# Patient Record
Sex: Male | Born: 2000 | Race: White | Hispanic: No | Marital: Single | Smoking: Never smoker
Health system: Southern US, Community
[De-identification: ages and names within clinical notes are randomized; demographics above are authoritative.]

## PROBLEM LIST (undated history)

## (undated) DIAGNOSIS — J309 Allergic rhinitis, unspecified: Secondary | ICD-10-CM

## (undated) HISTORY — PX: TYMPANOSTOMY TUBE PLACEMENT: SHX32

## (undated) HISTORY — DX: Allergic rhinitis, unspecified: J30.9

---
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
## 2019-10-28 ENCOUNTER — Ambulatory Visit: Payer: BC Managed Care – PPO | Attending: Internal Medicine

## 2019-10-28 DIAGNOSIS — Z20822 Contact with and (suspected) exposure to covid-19: Secondary | ICD-10-CM

## 2019-10-29 LAB — NOVEL CORONAVIRUS, NAA: SARS-CoV-2, NAA: NOT DETECTED

## 2020-11-07 ENCOUNTER — Other Ambulatory Visit: Payer: Self-pay

## 2020-11-07 ENCOUNTER — Emergency Department (HOSPITAL_COMMUNITY)
Admission: EM | Admit: 2020-11-07 | Discharge: 2020-11-07 | Disposition: A | Discharge status: Home / Self Care | Payer: BC Managed Care – PPO | Attending: Emergency Medicine | Admitting: Emergency Medicine

## 2020-11-07 ENCOUNTER — Emergency Department (HOSPITAL_COMMUNITY): Payer: BC Managed Care – PPO

## 2020-11-07 ENCOUNTER — Encounter (HOSPITAL_COMMUNITY): Payer: Self-pay | Admitting: Emergency Medicine

## 2020-11-07 DIAGNOSIS — R197 Diarrhea, unspecified: Secondary | ICD-10-CM

## 2020-11-07 DIAGNOSIS — R112 Nausea with vomiting, unspecified: Secondary | ICD-10-CM

## 2020-11-07 DIAGNOSIS — B349 Viral infection, unspecified: Secondary | ICD-10-CM | POA: Diagnosis not present

## 2020-11-07 DIAGNOSIS — Z20822 Contact with and (suspected) exposure to covid-19: Secondary | ICD-10-CM | POA: Diagnosis not present

## 2020-11-07 LAB — COMPREHENSIVE METABOLIC PANEL
ALT: 64 U/L — ABNORMAL HIGH (ref 0–44)
AST: 81 U/L — ABNORMAL HIGH (ref 15–41)
Albumin: 5.4 g/dL — ABNORMAL HIGH (ref 3.5–5.0)
Alkaline Phosphatase: 78 U/L (ref 38–126)
Anion gap: 14 (ref 5–15)
BUN: 18 mg/dL (ref 6–20)
CO2: 21 mmol/L — ABNORMAL LOW (ref 22–32)
Calcium: 9.7 mg/dL (ref 8.9–10.3)
Chloride: 103 mmol/L (ref 98–111)
Creatinine, Ser: 0.96 mg/dL (ref 0.61–1.24)
GFR, Estimated: >60 mL/min (ref >60)
Glucose, Bld: 104 mg/dL — ABNORMAL HIGH (ref 70–99)
Potassium: 4.3 mmol/L (ref 3.5–5.1)
Sodium: 138 mmol/L (ref 135–145)
Total Bilirubin: 2.9 mg/dL — ABNORMAL HIGH (ref 0.3–1.2)
Total Protein: 8 g/dL (ref 6.5–8.1)

## 2020-11-07 LAB — CBC
HCT: 47.9 % (ref 39.0–52.0)
Hemoglobin: 16.6 g/dL (ref 13.0–17.0)
MCH: 29.7 pg (ref 26.0–34.0)
MCHC: 34.7 g/dL (ref 30.0–36.0)
MCV: 85.8 fL (ref 80.0–100.0)
Platelets: 188 10*3/uL (ref 150–400)
RBC: 5.58 MIL/uL (ref 4.22–5.81)
RDW: 12.4 % (ref 11.5–15.5)
WBC: 16.6 10*3/uL — ABNORMAL HIGH (ref 4.0–10.5)
nRBC: 0 % (ref 0.0–0.2)

## 2020-11-07 LAB — URINALYSIS, ROUTINE W REFLEX MICROSCOPIC
Bilirubin Urine: NEGATIVE
Glucose, UA: NEGATIVE mg/dL
Hgb urine dipstick: NEGATIVE
Ketones, ur: 80 mg/dL — AB
Leukocytes,Ua: NEGATIVE
Nitrite: NEGATIVE
Protein, ur: NEGATIVE mg/dL
Specific Gravity, Urine: 1.029 (ref 1.005–1.030)
pH: 5 (ref 5.0–8.0)

## 2020-11-07 LAB — RESP PANEL BY RT-PCR (FLU A&B, COVID) ARPGX2
Influenza A by PCR: NEGATIVE
Influenza B by PCR: NEGATIVE
SARS Coronavirus 2 by RT PCR: NEGATIVE

## 2020-11-07 LAB — LIPASE, BLOOD: Lipase: 32 U/L (ref 11–51)

## 2020-11-07 MED ORDER — ONDANSETRON HCL 4 MG/2ML IJ SOLN
4.0000 mg | Freq: Once | INTRAMUSCULAR | Status: AC
  Administered 2020-11-07: 4 mg via INTRAVENOUS
  Filled 2020-11-07: qty 2

## 2020-11-07 MED ORDER — SODIUM CHLORIDE 0.9 % IV BOLUS
1000.0000 mL | Freq: Once | INTRAVENOUS | Status: AC
  Administered 2020-11-07: 1000 mL via INTRAVENOUS

## 2020-11-07 MED ORDER — ONDANSETRON HCL 8 MG PO TABS: 8.0000 mg | ORAL_TABLET | Freq: Three times a day (TID) | ORAL | 0 refills | Status: AC | PRN

## 2020-11-07 MED ORDER — IOHEXOL 300 MG/ML  SOLN
100.0000 mL | Freq: Once | INTRAMUSCULAR | Status: AC | PRN
  Administered 2020-11-07: 100 mL via INTRAVENOUS

## 2020-11-07 MED ORDER — LORAZEPAM 2 MG/ML IJ SOLN
1.0000 mg | Freq: Once | INTRAMUSCULAR | Status: AC
  Administered 2020-11-07: 1 mg via INTRAVENOUS
  Filled 2020-11-07: qty 1

## 2020-11-07 NOTE — ED Triage Notes (Signed)
Patient here reports n/v left sided abd pain that started today.

## 2020-11-07 NOTE — ED Provider Notes (Signed)
[facility]-EMERGENCY DEPT Provider Note   CSN: [id number] Arrival date & time: 11/07/20  1824     History Chief Complaint  Patient presents with  . Abdominal Pain  . Nausea  . Emesis    Alejandro Wilson is a 20 y.o. male.  HPI He presents for evaluation of nausea, vomiting and diarrhea which started yesterday.  No blood in emesis or stool.  No fever.  No suspected food ingestions that would have caused this.  He was in [place] and a tennis tournament over the weekend, and has been uncomfortable with vomiting and diarrhea during a trip back to [place] today.  No ongoing gastrointestinal disorders.  He has had Covid vaccines and a booster.  No known sick contacts.  There are no other known active modifying factors.    Past Medical History:  Diagnosis Date  . Allergic rhinitis     Patient Active Problem List   Diagnosis Date Noted  . Chest pain 04/09/2018  . Palpitations 04/09/2018    Past Surgical History:  Procedure Laterality Date  . TYMPANOSTOMY TUBE PLACEMENT         Family History  Problem Relation Age of Onset  . Hypertension Father   . Allergic rhinitis Father   . Hypertension Paternal Grandfather     Social History   Tobacco Use  . Smoking status: Never Smoker  . Smokeless tobacco: Never Used  Vaping Use  . Vaping Use: Never used  Substance Use Topics  . Alcohol use: Never  . Drug use: Never    Home Medications Prior to Admission medications   Medication Sig Start Date End Date Taking? Authorizing Provider  bismuth subsalicylate (PEPTO BISMOL) 262 MG chewable tablet Chew 524 mg by mouth 2 (two) times daily as needed for indigestion.   Yes [provider]  ondansetron (ZOFRAN) 8 MG tablet Take 1 tablet (8 mg total) by mouth every 8 (eight) hours as needed for nausea or vomiting. 11/07/20  Yes Mancel Bale, MD  alclomethasone (ACLOVATE) 0.05 % ointment Apply 1 application topically 2 (two) times daily. 11/02/20    [provider]  fluticasone (FLONASE) 50 MCG/ACT nasal spray Place 2 sprays into both nostrils daily. Patient not taking: No sig reported    [provider]    Allergies    Patient has no known allergies.  Review of Systems   Review of Systems  All other systems reviewed and are negative.   Physical Exam Updated Vital Signs BP 110/71   Pulse 74   Temp 98 F (36.7 C) (Oral)   Resp 18   SpO2 100%   Physical Exam Vitals and nursing note reviewed.  Constitutional:      General: He is in acute distress (Uncomfortable).     Appearance: He is well-developed and well-nourished. He is not ill-appearing, toxic-appearing or diaphoretic.  HENT:     Head: Normocephalic and atraumatic.     Right Ear: External ear normal.     Left Ear: External ear normal.  Eyes:     Extraocular Movements: EOM normal.     Conjunctiva/sclera: Conjunctivae normal.     Pupils: Pupils are equal, round, and reactive to light.  Neck:     Trachea: Phonation normal.  Cardiovascular:     Rate and Rhythm: Normal rate and regular rhythm.     Heart sounds: Normal heart sounds.  Pulmonary:     Effort: Pulmonary effort is normal.     Breath sounds: Normal breath sounds.  Chest:  Chest wall: No bony tenderness.  Abdominal:     General: There is no distension.     Palpations: Abdomen is soft. There is no mass.     Tenderness: There is abdominal tenderness (Mid lower, moderate). There is guarding. There is no rebound.     Hernia: No hernia is present.  Musculoskeletal:        General: Normal range of motion.     Cervical back: Normal range of motion and neck supple.  Skin:    General: Skin is warm, dry and intact.  Neurological:     Mental Status: He is alert and oriented to person, place, and time.     Cranial Nerves: No cranial nerve deficit.     Sensory: No sensory deficit.     Motor: No abnormal muscle tone.     Coordination: Coordination normal.  Psychiatric:        Mood and  Affect: Mood and affect and mood normal.        Behavior: Behavior normal.        Thought Content: Thought content normal.        Judgment: Judgment normal.     ED Results / Procedures / Treatments   Labs (all labs ordered are listed, but only abnormal results are displayed) Labs Reviewed  COMPREHENSIVE METABOLIC PANEL - Abnormal; Notable for the following components:      Result Value   CO2 21 (*)    Glucose, Bld 104 (*)    Albumin 5.4 (*)    AST 81 (*)    ALT 64 (*)    Total Bilirubin 2.9 (*)    All other components within normal limits  CBC - Abnormal; Notable for the following components:   WBC 16.6 (*)    All other components within normal limits  URINALYSIS, ROUTINE W REFLEX MICROSCOPIC - Abnormal; Notable for the following components:   Ketones, ur 80 (*)    All other components within normal limits  RESP PANEL BY RT-PCR (FLU A&B, COVID) ARPGX2  LIPASE, BLOOD    EKG None  Radiology CT Abdomen Pelvis W Contrast  Result Date: 11/07/2020 CLINICAL DATA:  Acute left-sided abdominal pain. EXAM: CT ABDOMEN AND PELVIS WITH CONTRAST TECHNIQUE: Multidetector CT imaging of the abdomen and pelvis was performed using the standard protocol following bolus administration of intravenous contrast. CONTRAST:  100mL OMNIPAQUE IOHEXOL 300 MG/ML  SOLN COMPARISON:  None. FINDINGS: Lower chest: No acute abnormality. Hepatobiliary: No focal liver abnormality is seen. No gallstones, gallbladder wall thickening, or biliary dilatation. Pancreas: Unremarkable. No pancreatic ductal dilatation or surrounding inflammatory changes. Spleen: Normal in size without focal abnormality. Adrenals/Urinary Tract: Adrenal glands are unremarkable. Kidneys are normal, without renal calculi, focal lesion, or hydronephrosis. Bladder is unremarkable. Stomach/Bowel: Stomach is within normal limits. Appendix appears normal. No evidence of bowel wall thickening, distention, or inflammatory changes. Vascular/Lymphatic: No  significant vascular findings are present. No enlarged abdominal or pelvic lymph nodes. Reproductive: Prostate is unremarkable. Other: No abdominal wall hernia or abnormality. No abdominopelvic ascites. Musculoskeletal: Mild grade 1 anterolisthesis of L5-S1 is noted secondary to bilateral L5 spondylolysis. IMPRESSION: 1. Mild grade 1 anterolisthesis of L5-S1 secondary to bilateral L5 spondylolysis. 2. No other abnormality seen in the abdomen or pelvis. Electronically Signed   By: Lupita RaiderJames  Green Jr M.D.   On: 11/07/2020 20:44    Procedures Procedures   Medications Ordered in ED Medications  sodium chloride 0.9 % bolus 1,000 mL (0 mLs Intravenous Stopped 11/07/20 2030)  ondansetron (ZOFRAN)  injection 4 mg (4 mg Intravenous Given 11/07/20 1918)  iohexol (OMNIPAQUE) 300 MG/ML solution 100 mL (100 mLs Intravenous Contrast Given 11/07/20 2027)  sodium chloride 0.9 % bolus 1,000 mL (0 mLs Intravenous Stopped 11/07/20 2324)  LORazepam (ATIVAN) injection 1 mg (1 mg Intravenous Given 11/07/20 2210)    ED Course  I have reviewed the triage vital signs and the nursing notes.  Pertinent labs & imaging results that were available during my care of the patient were reviewed by me and considered in my medical decision making (see chart for details).  Clinical Course as of 11/07/20 2327  Wynelle Link Nov 07, 2020  2156 At this time he states he feels: "Ill, shaky and dehydrated."  Ordered additional IV fluids and Ativan [EW]    Clinical Course User Index [EW] Mancel Bale, MD   MDM Rules/Calculators/A&P                           Patient Vitals for the past 24 hrs:  BP Temp Temp src Pulse Resp SpO2  11/07/20 2130 110/71 -- -- 74 18 100 %  11/07/20 2100 110/69 -- -- 77 -- 100 %  11/07/20 2000 103/63 -- -- 76 -- 100 %  11/07/20 1900 (!) 134/115 -- -- 76 -- 100 %  11/07/20 1856 125/69 -- -- 91 19 100 %  11/07/20 1836 112/89 98 F (36.7 C) Oral (!) 106 20 100 %    11:27 PM Reevaluation with update and  discussion. After initial assessment and treatment, an updated evaluation reveals he is tolerating oral liquids and feels better now.  Findings discussed and questions answered. Mancel Bale   Medical Decision Making:  This patient is presenting for evaluation of nausea, vomiting, diarrhea and abdominal pain, which does require a range of treatment options, and is a complaint that involves a moderate risk of morbidity and mortality. The differential diagnoses include appendicitis, colitis, food poisoning. I decided to review old records, and in summary healthy young male presenting with nausea, vomiting and diarrhea which started spontaneously yesterday.  I obtained additional historical information from mother at bedside.  Clinical Laboratory Tests Ordered, included CBC, Metabolic panel, Urinalysis and Covid testing for screening purposes. Review indicates normal except CO2 low, glucose high, albumin high, AST high, ALT high, total bilirubin high, white count high, lipase normal, urinalysis with ketones. Radiologic Tests Ordered, included CT abdomen pel.  I independently Visualized: Radiograph images, which show no acute abnormalities  Critical Interventions-clinical evaluation, laboratory testing, IV fluids, medication treatment, observation and reassessment with additional treatment.  After These Interventions, the Patient was reevaluated and was found with improved symptoms after treatment, stable for discharge with ongoing care at home.  Suspect viral process, with mild inflammation of the liver.  Doubt acute viral hepatitis.  Patient stable to be discharged with symptomatic care and follow-up with his PCP for further management.  CRITICAL CARE-no Performed by: Mancel Bale  Nursing Notes Reviewed/ Care Coordinated Applicable Imaging Reviewed Interpretation of Laboratory Data incorporated into ED treatment  The patient appears reasonably screened and/or stabilized for discharge and I  doubt any other medical condition or other [facility] requiring further screening, evaluation, or treatment in the ED at this time prior to discharge.  Plan: Home Medications-symptomatic treatment; Home Treatments-gradual advance diet; return here if the recommended treatment, does not improve the symptoms; Recommended follow up-PCP, as needed, and for repeat blood testing in 5 to 7 days.     Final Clinical  Impression(s) / ED Diagnoses Final diagnoses:  Nausea vomiting and diarrhea  Viral illness    Rx / DC Orders ED Discharge Orders         Ordered    ondansetron (ZOFRAN) 8 MG tablet  Every 8 hours PRN        11/07/20 2323           Mancel Bale, MD 11/07/20 2327

## 2020-11-07 NOTE — Discharge Instructions (Addendum)
Start with a clear liquid diet then gradually advance to regular foods and liquids after 12 to 24 hours.  Follow-up with the doctor of your choice for checkup in 5 to 7 days.  Asked them to recheck your blood tests which indicated some mild inflammation of your liver.  It is likely that this information is caused by a virus.

## 2022-02-28 IMAGING — CT CT ABD-PELV W/ CM
2 of 4 series · 17 of 46 positions shown, 19 images · IV contrast (OMNIPAQUE 300)
Comparison: None.

CLINICAL DATA: Acute left-sided abdominal pain.

EXAM:
CT ABDOMEN AND PELVIS WITH CONTRAST
TECHNIQUE: Multidetector CT imaging of the abdomen and pelvis was performed
using the standard protocol following bolus administration of
intravenous contrast.
CONTRAST:  100mL OMNIPAQUE IOHEXOL 300 MG/ML  SOLN

[Series 2: axial st · axial · 0.73mm/px · z∈[-578,-163]mm · 14 of 95 slices shown, 16 images]
[im 6/95  soft-tissue]
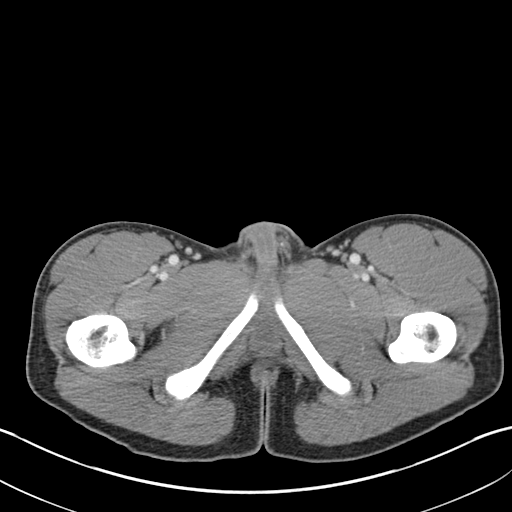
[im 6/95  bone]
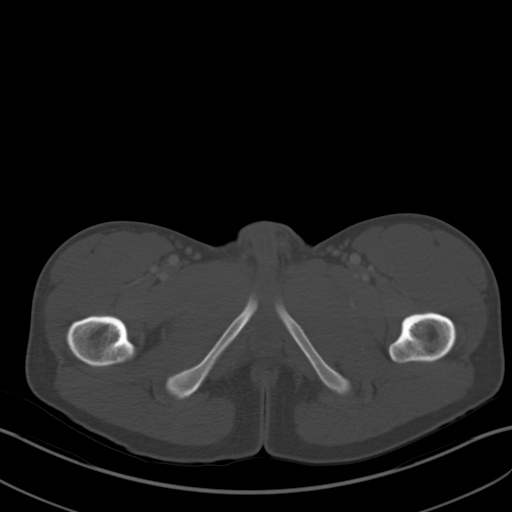
[im 11/95  soft-tissue]
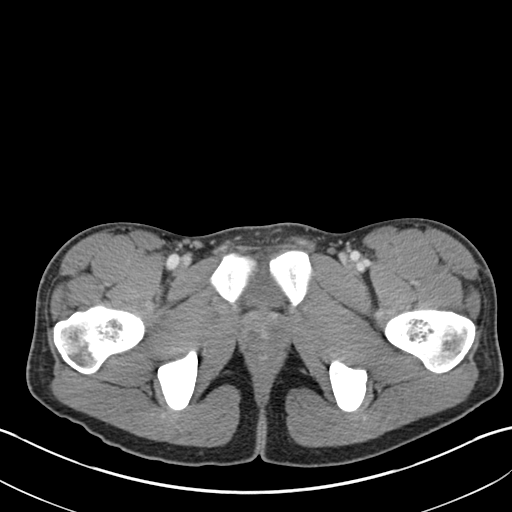
[im 21/95  soft-tissue]
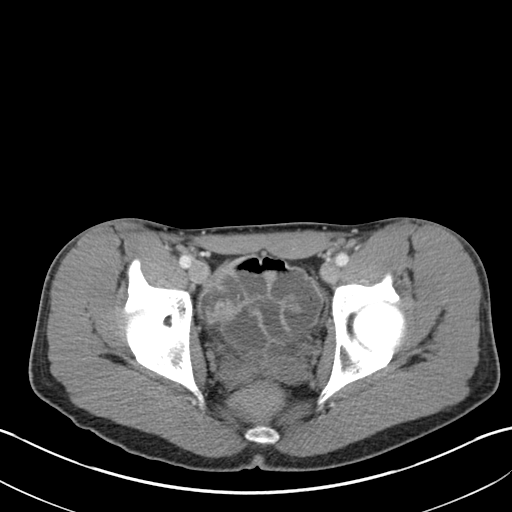
[im 27/95  soft-tissue]
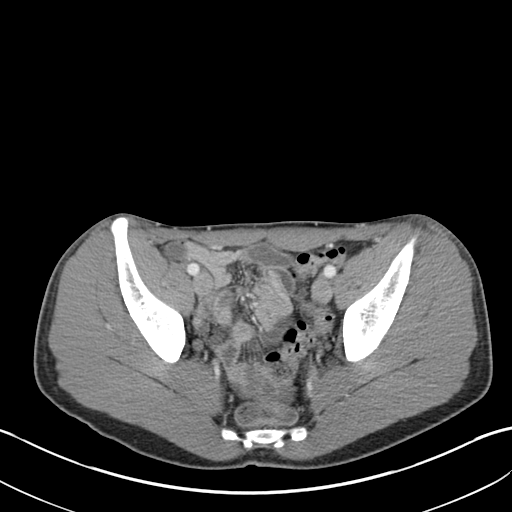
[im 32/95  soft-tissue]
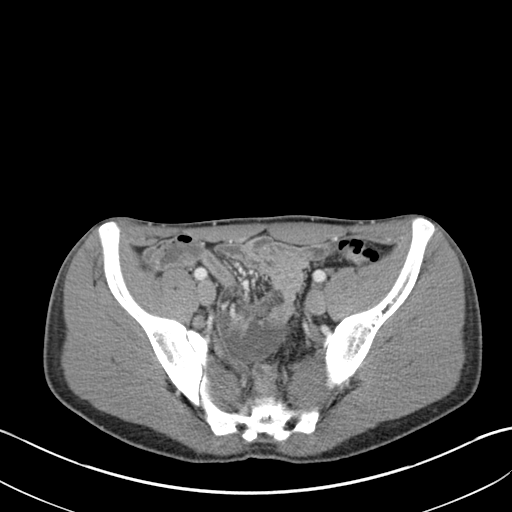
[im 37/95  soft-tissue]
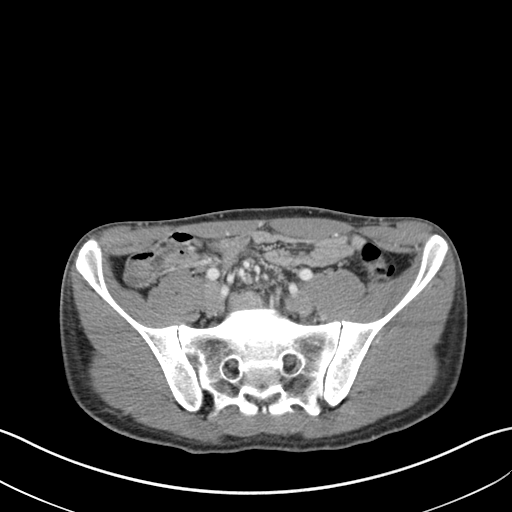
[im 42/95  soft-tissue]
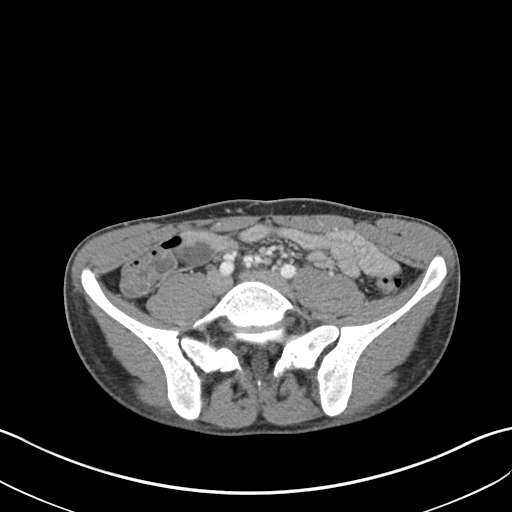
[im 53/95  soft-tissue]
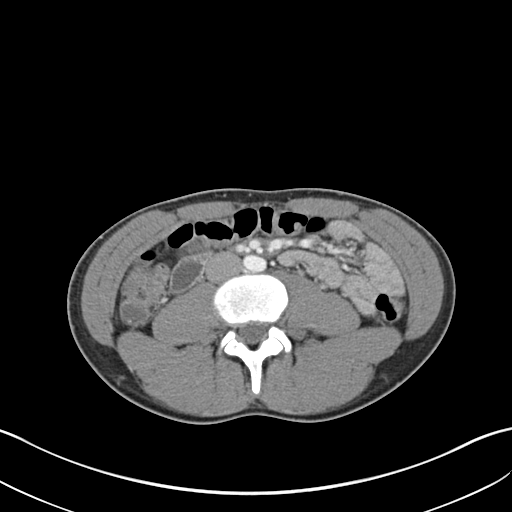
[im 58/95  soft-tissue]
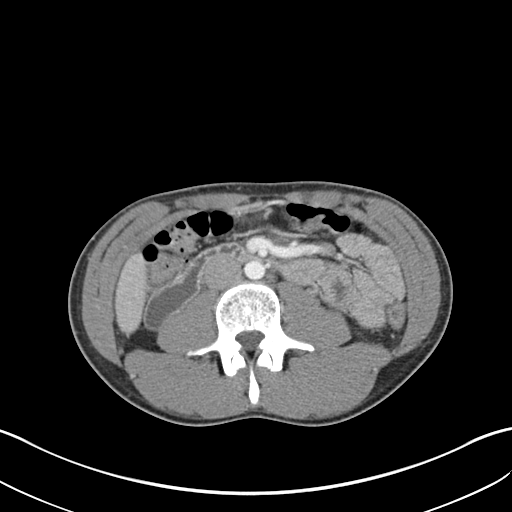
[im 58/95  bone]
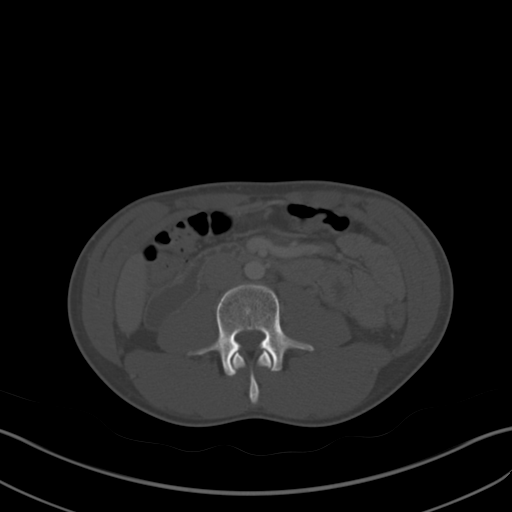
[im 63/95  soft-tissue]
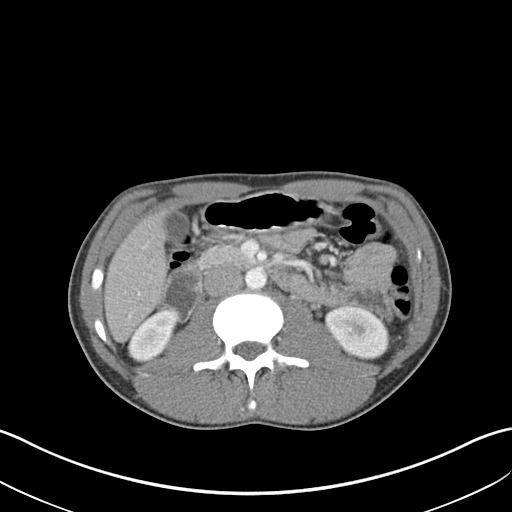
[im 68/95  soft-tissue]
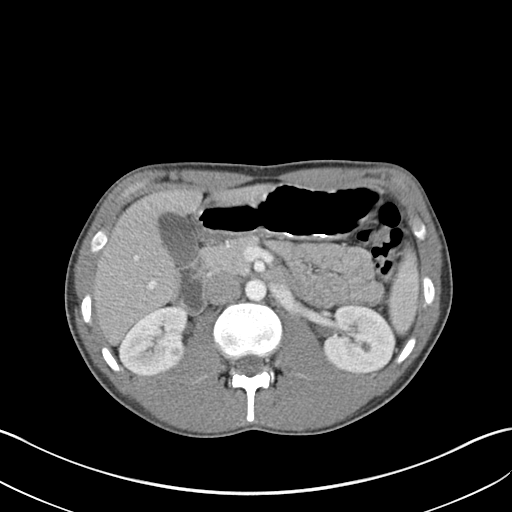
[im 74/95  soft-tissue]
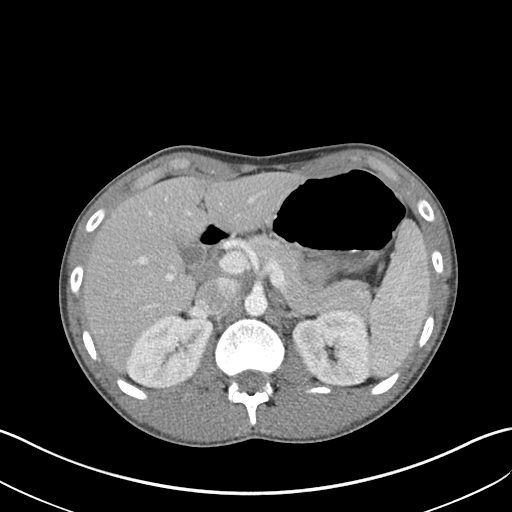
[im 84/95  soft-tissue]
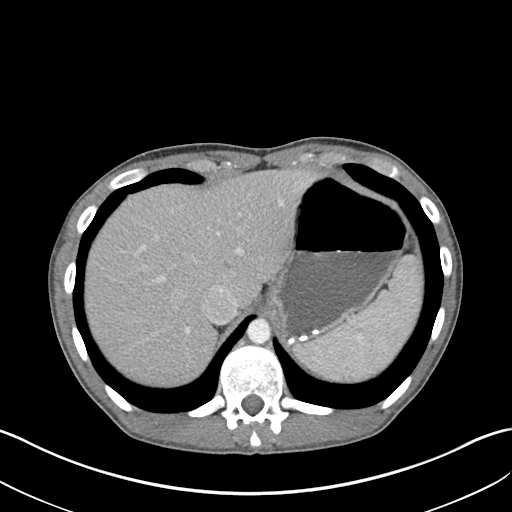
[im 89/95  soft-tissue]
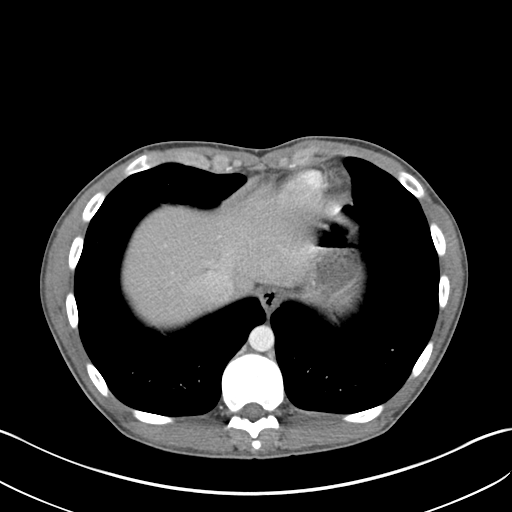

[Series 5: coronal st · coronal · 0.89mm/px · 3 of 110 slices shown]
[im 37/110  soft-tissue]
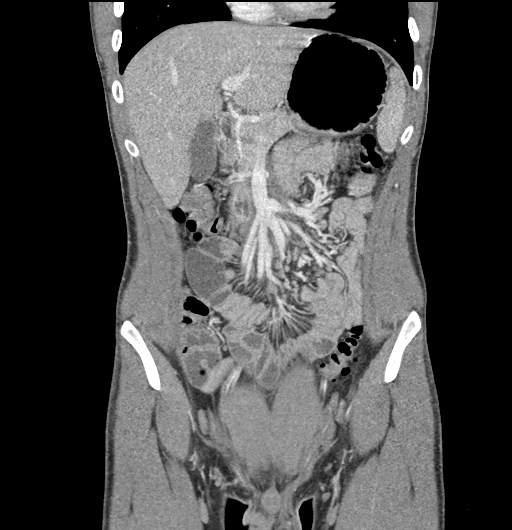
[im 49/110  soft-tissue]
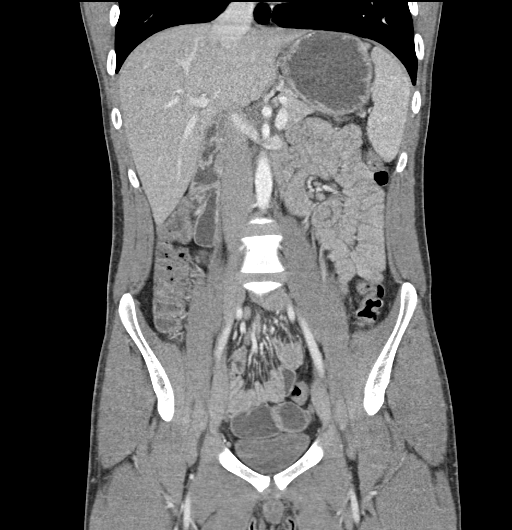
[im 61/110  soft-tissue]
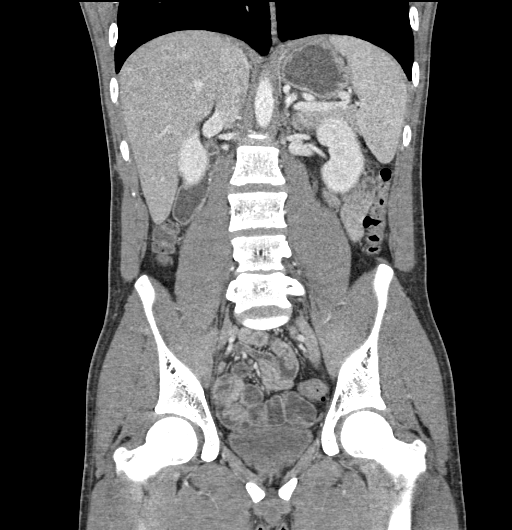

[17 of 46 positions shown; findings below may reference images not displayed]

FINDINGS: Lower chest: No acute abnormality.

Hepatobiliary: No focal liver abnormality is seen. No gallstones,
gallbladder wall thickening, or biliary dilatation.

Pancreas: Unremarkable. No pancreatic ductal dilatation or
surrounding inflammatory changes.

Spleen: Normal in size without focal abnormality.

Adrenals/Urinary Tract: Adrenal glands are unremarkable. Kidneys are
normal, without renal calculi, focal lesion, or hydronephrosis.
Bladder is unremarkable.

Stomach/Bowel: Stomach is within normal limits. Appendix appears
normal. No evidence of bowel wall thickening, distention, or
inflammatory changes.

Vascular/Lymphatic: No significant vascular findings are present. No
enlarged abdominal or pelvic lymph nodes.

Reproductive: Prostate is unremarkable.

Other: No abdominal wall hernia or abnormality. No abdominopelvic
ascites.

Musculoskeletal: Mild grade 1 anterolisthesis of L5-S1 is noted
secondary to bilateral L5 spondylolysis.
IMPRESSION: 1. Mild grade 1 anterolisthesis of L5-S1 secondary to bilateral L5
spondylolysis.
2. No other abnormality seen in the abdomen or pelvis.
